# Patient Record
Sex: Male | Born: 1995
Health system: Southern US, Community
[De-identification: ages and names within clinical notes are randomized; demographics above are authoritative.]

## PROBLEM LIST (undated history)

## (undated) DIAGNOSIS — J45909 Unspecified asthma, uncomplicated: Secondary | ICD-10-CM

---
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
## 2013-05-09 ENCOUNTER — Encounter (HOSPITAL_COMMUNITY): Payer: Self-pay | Admitting: Emergency Medicine

## 2013-05-09 ENCOUNTER — Emergency Department (HOSPITAL_COMMUNITY)
Admission: EM | Admit: 2013-05-09 | Discharge: 2013-05-09 | Disposition: A | Discharge status: Home / Self Care | Payer: 59 | Source: Home / Self Care | Attending: Emergency Medicine | Admitting: Emergency Medicine

## 2013-05-09 DIAGNOSIS — H109 Unspecified conjunctivitis: Secondary | ICD-10-CM

## 2013-05-09 DIAGNOSIS — Z23 Encounter for immunization: Secondary | ICD-10-CM

## 2013-05-09 HISTORY — DX: Unspecified asthma, uncomplicated: J45.909

## 2013-05-09 MED ORDER — INFLUENZA VAC SPLIT QUAD 0.5 ML IM SUSP
INTRAMUSCULAR | Status: AC
  Filled 2013-05-09: qty 0.5

## 2013-05-09 MED ORDER — POLYMYXIN B-TRIMETHOPRIM 10000-0.1 UNIT/ML-% OP SOLN: 1.0000 [drp] | OPHTHALMIC | Status: DC

## 2013-05-09 MED ORDER — INFLUENZA VAC SPLIT QUAD 0.5 ML IM SUSP
0.5000 mL | INTRAMUSCULAR | Status: AC
  Administered 2013-05-09: 0.5 mL via INTRAMUSCULAR

## 2013-05-09 NOTE — ED Provider Notes (Signed)
**Note De-Identified [place] Obfuscation** Chief Complaint:   Chief Complaint  Patient presents with  . Conjunctivitis    History of Present Illness:   Todd Stephenson is a 17 year old male who has had a two-day history of redness of the right eye, irritation, and yellowish green drainage. He denies any changes in his vision, photophobia, involvement of the left side, fever, chills, headache, nasal congestion, rhinorrhea, sore throat, adenopathy, or cough. He has had no sick exposures.  Review of Systems:  Other than noted above, the patient denies any of the following symptoms: Systemic:  No fever, chills, sweats, fatigue, or weight loss. Eye:  No redness, eye pain, photophobia, discharge, blurred vision, or diplopia. ENT:  No nasal congestion, rhinorrhea, or sore throat. Lymphatic:  No adenopathy. Skin:  No rash or pruritis.  PMFSH:  Past medical history, family history, social history, meds, and allergies were reviewed.  He has a history of asthma and takes Pulmicort.  Physical Exam:   Vital signs:  BP 137/90  Pulse 68  Temp(Src) 98.3 F (36.8 C) (Oral)  Resp 18  SpO2 100% General:  Alert and in no distress. Eye:  Lids and conjunctiva is are normal. There is no injection, no discharge, cornea was intact to fluorescein staining, anterior chamber was normal, PERRLA, full EOMs, fundi were benign. ENT:  TMs and canals clear.  Nasal mucosa normal.  No intra-oral lesions, mucous membranes moist, pharynx clear. Neck:  No adenopathy tenderness or mass. Skin:  Clear, warm and dry.  Assessment:  The encounter diagnosis was Conjunctivitis.  He has minimal conjunctivitis. His eye looking to normal today.  Plan:   1.  Meds:  The following meds were prescribed:   Discharge Medication List as of 05/09/2013  5:12 PM    START taking these medications   Details  trimethoprim-polymyxin b (POLYTRIM) ophthalmic solution Place 1 drop into the right eye every 4 (four) hours., Starting 05/09/2013, Until Discontinued, Normal        2.   Patient Education/Counseling:  The patient was given appropriate handouts, self care instructions, and instructed in symptomatic relief.  Advised moist warm compresses, avoidance of rubbing the eyes, and use of sunglasses.  3.  Follow up:  The patient was told to follow up if no better in 3 to 4 days, if becoming worse in any way, and given some red flag symptoms such as any changes in his vision which would prompt immediate return.  Follow up here if needed.      Reuben Likes, MD 05/09/13 [phone]

## 2013-05-09 NOTE — ED Notes (Signed)
**Note De-Identified [place]elle[place] Obfuscation** C/o pink eye OD onset yesterday morning with greenish yellow drainage.  No visual difficulty.

## 2017-08-05 ENCOUNTER — Other Ambulatory Visit: Payer: Self-pay

## 2017-08-05 ENCOUNTER — Encounter (HOSPITAL_COMMUNITY): Payer: Self-pay | Admitting: Emergency Medicine

## 2017-08-05 ENCOUNTER — Ambulatory Visit (HOSPITAL_COMMUNITY)
Admission: EM | Admit: 2017-08-05 | Discharge: 2017-08-05 | Disposition: A | Discharge status: Home / Self Care | Payer: 59 | Attending: Internal Medicine | Admitting: Internal Medicine

## 2017-08-05 DIAGNOSIS — H5712 Ocular pain, left eye: Secondary | ICD-10-CM | POA: Diagnosis not present

## 2017-08-05 MED ORDER — TETRACAINE HCL 0.5 % OP SOLN
OPHTHALMIC | Status: AC
  Filled 2017-08-05: qty 4

## 2017-08-05 NOTE — ED Triage Notes (Signed)
**Note De-Identified [place] Obfuscation** Pt c/o L eye issues, redness, swelling, drainage x4 days.

## 2017-08-05 NOTE — Discharge Instructions (Signed)
**Note De-Identified Gerke Obfuscation** [facility]

## 2017-08-05 NOTE — ED Provider Notes (Signed)
**Note De-Identified Cavey Obfuscation** [facility]    CSN: [id number] Arrival date & time: 08/05/17  1311     History   Chief Complaint Chief Complaint  Patient presents with  . Conjunctivitis    HPI Akari Crysler Turley is a 22 y.o. male.   Kahner presents with complaints of left eye redness, tearing, swelling and pain which has been worsening over the past 4 days. He feels the tears are worsening his vision. He has pain to his eye ball as well as to his lid and to his face. He does not have an eye doctor. Does not use contacts or glasses. Rates pain 8/10. States he felt it was a stye originally with white area under his upper lid. Without pus or discharge from the eye, without am mattering. He feels he has had low grade fevers. Denies previous similar.    ROS per HPI.       Past Medical History:  Diagnosis Date  . Asthma    as a child    Patient Active Problem List   Diagnosis Date Noted  . Well child check 06/02/2012  . Obesity 06/02/2012  . Immunization due 12/30/2011  . Asthma night-time symptoms 06/02/2011    Class: Diagnosis of    History reviewed. No pertinent surgical history.     Home Medications    Prior to Admission medications   Medication Sig Start Date End Date Taking? Authorizing Provider  budesonide (PULMICORT FLEXHALER) 180 MCG/ACT inhaler Inhale 1-2 puffs into the lungs 2 (two) times daily. Patient not taking: Reported on 08/05/2017 02/23/12 03/24/12  Georgiann Hahn, MD  budesonide (PULMICORT FLEXHALER) 180 MCG/ACT inhaler Inhale 1 puff into the lungs 2 (two) times daily. 90 day supply Patient not taking: Reported on 08/05/2017 06/02/12   Georgiann Hahn, MD  pirbuterol (MAXAIR AUTOHALER) 200 MCG/INH inhaler Inhale 2 puffs into the lungs 4 (four) times daily. 90 day supply Patient not taking: Reported on 08/05/2017 06/02/12   Georgiann Hahn, MD  trimethoprim-polymyxin b (POLYTRIM) ophthalmic solution Place 1 drop into the right eye every 4 (four) hours. Patient not taking:  Reported on 08/05/2017 05/09/13   Reuben Likes, MD    Family History No family history on file.  Social History Social History   Tobacco Use  . Smoking status: Never Smoker  Substance Use Topics  . Alcohol use: No  . Drug use: No     Allergies   Patient has no known allergies.   Review of Systems Review of Systems   Physical Exam Triage Vital Signs ED Triage Vitals [08/05/17 1444]  Enc Vitals Group     BP      Pulse      Resp      Temp      Temp src      SpO2      Weight      Height      Head Circumference      Peak Flow      Pain Score 7     Pain Loc      Pain Edu?      Excl. in GC?    No data found.  Updated Vital Signs There were no vitals taken for this visit.  Visual Acuity Right Eye Distance: (S) 20/20 w/o corrections Left Eye Distance: (S) 20/25 w/o corrections Bilateral Distance: (S) 20/20 w/o corrections  Right Eye Near:   Left Eye Near:    Bilateral Near:     Physical Exam  Constitutional: He is oriented **Note De-Identified Breden Obfuscation** to person, place, and time. He appears well-developed and well-nourished.  Eyes: Pupils are equal, round, and reactive to light. Left conjunctiva is injected. Left eye exhibits normal extraocular motion and no nystagmus.  Left eye with swelling to conjunctiva, upper and lower lids; tender; swelling extends to left temple; fluorescein deferred as [facility]  Cardiovascular: Normal rate and regular rhythm.  Pulmonary/Chest: Effort normal and breath sounds normal.  Neurological: He is alert and oriented to person, place, and time.  Skin: Skin is warm and dry.     UC Treatments / Results  Labs (all labs ordered are listed, but only abnormal results are displayed) Labs Reviewed - No data to display  EKG  EKG Interpretation None       Radiology No results found.  Procedures Procedures (including critical care time)  Medications Ordered in UC Medications - No data to  display   Initial Impression / Assessment and Plan / UC Course  I have reviewed the triage vital signs and the nursing notes.  Pertinent labs & imaging results that were available during my care of the patient were reviewed by me and considered in my medical decision making (see chart for details).     Eye lid and eye ball pain with excessive swelling, tenderness and tearing. Call to [facility] office and they are able to see patient now, patient agreeable to thorough eye evaluation and safe for self transport.   Final Clinical Impressions(s) / UC Diagnoses   Final diagnoses:  Eye pain, left    ED Discharge Orders    None       Controlled Substance Prescriptions [place] Controlled Substance Registry consulted? Not Applicable   Georgetta HaberBurky, Natalie B, NP 08/05/17 1558

## 2018-06-14 ENCOUNTER — Other Ambulatory Visit: Payer: Self-pay

## 2018-06-14 ENCOUNTER — Encounter (HOSPITAL_COMMUNITY): Payer: Self-pay | Admitting: Pharmacy Technician

## 2018-06-14 ENCOUNTER — Emergency Department (HOSPITAL_COMMUNITY): Payer: No Typology Code available for payment source

## 2018-06-14 ENCOUNTER — Emergency Department (HOSPITAL_COMMUNITY)
Admission: EM | Admit: 2018-06-14 | Discharge: 2018-06-15 | Disposition: A | Discharge status: Home / Self Care | Payer: No Typology Code available for payment source | Attending: Emergency Medicine | Admitting: Emergency Medicine

## 2018-06-14 DIAGNOSIS — S2242XA Multiple fractures of ribs, left side, initial encounter for closed fracture: Secondary | ICD-10-CM

## 2018-06-14 DIAGNOSIS — Y939 Activity, unspecified: Secondary | ICD-10-CM | POA: Diagnosis not present

## 2018-06-14 DIAGNOSIS — S80812A Abrasion, left lower leg, initial encounter: Secondary | ICD-10-CM | POA: Diagnosis not present

## 2018-06-14 DIAGNOSIS — R319 Hematuria, unspecified: Secondary | ICD-10-CM

## 2018-06-14 DIAGNOSIS — Y999 Unspecified external cause status: Secondary | ICD-10-CM | POA: Diagnosis not present

## 2018-06-14 DIAGNOSIS — S80211A Abrasion, right knee, initial encounter: Secondary | ICD-10-CM | POA: Insufficient documentation

## 2018-06-14 DIAGNOSIS — S32020A Wedge compression fracture of second lumbar vertebra, initial encounter for closed fracture: Secondary | ICD-10-CM | POA: Diagnosis not present

## 2018-06-14 DIAGNOSIS — S40212A Abrasion of left shoulder, initial encounter: Secondary | ICD-10-CM | POA: Insufficient documentation

## 2018-06-14 DIAGNOSIS — T07XXXA Unspecified multiple injuries, initial encounter: Secondary | ICD-10-CM | POA: Diagnosis not present

## 2018-06-14 DIAGNOSIS — Y929 Unspecified place or not applicable: Secondary | ICD-10-CM | POA: Insufficient documentation

## 2018-06-14 LAB — COMPREHENSIVE METABOLIC PANEL
ALBUMIN: 4.4 g/dL (ref 3.5–5.0)
ALT: 53 U/L — ABNORMAL HIGH (ref 0–44)
ANION GAP: 9 (ref 5–15)
AST: 52 U/L — ABNORMAL HIGH (ref 15–41)
Alkaline Phosphatase: 56 U/L (ref 38–126)
BUN: 14 mg/dL (ref 6–20)
CHLORIDE: 104 mmol/L (ref 98–111)
CO2: 24 mmol/L (ref 22–32)
Calcium: 9.4 mg/dL (ref 8.9–10.3)
Creatinine, Ser: 1.37 mg/dL — ABNORMAL HIGH (ref 0.61–1.24)
GFR calc Af Amer: >60 mL/min (ref >60)
GFR calc non Af Amer: >60 mL/min (ref >60)
GLUCOSE: 116 mg/dL — AB (ref 70–99)
POTASSIUM: 3.6 mmol/L (ref 3.5–5.1)
Sodium: 137 mmol/L (ref 135–145)
TOTAL PROTEIN: 7 g/dL (ref 6.5–8.1)
Total Bilirubin: 1.4 mg/dL — ABNORMAL HIGH (ref 0.3–1.2)

## 2018-06-14 LAB — SAMPLE TO BLOOD BANK

## 2018-06-14 LAB — I-STAT CHEM 8, ED
BUN: 15 mg/dL (ref 6–20)
CALCIUM ION: 1.12 mmol/L — AB (ref 1.15–1.40)
Chloride: 104 mmol/L (ref 98–111)
Creatinine, Ser: 1.2 mg/dL (ref 0.61–1.24)
Glucose, Bld: 110 mg/dL — ABNORMAL HIGH (ref 70–99)
HEMATOCRIT: 48 % (ref 39.0–52.0)
HEMOGLOBIN: 16.3 g/dL (ref 13.0–17.0)
Potassium: 3.6 mmol/L (ref 3.5–5.1)
SODIUM: 137 mmol/L (ref 135–145)
TCO2: 24 mmol/L (ref 22–32)

## 2018-06-14 LAB — ETHANOL

## 2018-06-14 LAB — CBC
HEMATOCRIT: 46.5 % (ref 39.0–52.0)
HEMOGLOBIN: 14.9 g/dL (ref 13.0–17.0)
MCH: 25.8 pg — AB (ref 26.0–34.0)
MCHC: 32 g/dL (ref 30.0–36.0)
MCV: 80.4 fL (ref 80.0–100.0)
Platelets: 177 10*3/uL (ref 150–400)
RBC: 5.78 MIL/uL (ref 4.22–5.81)
RDW: 12.9 % (ref 11.5–15.5)
WBC: 24.8 10*3/uL — AB (ref 4.0–10.5)
nRBC: 0 % (ref 0.0–0.2)

## 2018-06-14 LAB — PROTIME-INR
INR: 1.02
Prothrombin Time: 13.3 seconds (ref 11.4–15.2)

## 2018-06-14 LAB — I-STAT CG4 LACTIC ACID, ED: LACTIC ACID, VENOUS: 1.94 mmol/L — AB (ref 0.5–1.9)

## 2018-06-14 LAB — CDS SEROLOGY

## 2018-06-14 MED ORDER — HYDROMORPHONE HCL 1 MG/ML IJ SOLN
0.5000 mg | Freq: Once | INTRAMUSCULAR | Status: AC
  Administered 2018-06-14: 0.5 mg via INTRAVENOUS
  Filled 2018-06-14: qty 1

## 2018-06-14 MED ORDER — SODIUM CHLORIDE 0.9 % IV BOLUS
125.0000 mL | Freq: Once | INTRAVENOUS | Status: AC
  Administered 2018-06-14: 125 mL via INTRAVENOUS

## 2018-06-14 MED ORDER — IOHEXOL 300 MG/ML  SOLN
100.0000 mL | Freq: Once | INTRAMUSCULAR | Status: AC | PRN
  Administered 2018-06-15: 100 mL via INTRAVENOUS
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
## 2018-06-15 ENCOUNTER — Emergency Department (HOSPITAL_COMMUNITY): Payer: No Typology Code available for payment source

## 2018-06-15 DIAGNOSIS — S2242XA Multiple fractures of ribs, left side, initial encounter for closed fracture: Secondary | ICD-10-CM | POA: Diagnosis not present

## 2018-06-15 LAB — URINALYSIS, ROUTINE W REFLEX MICROSCOPIC
Bacteria, UA: NONE SEEN
Bilirubin Urine: NEGATIVE
GLUCOSE, UA: NEGATIVE mg/dL
Ketones, ur: 20 mg/dL — AB
Leukocytes, UA: NEGATIVE
Nitrite: NEGATIVE
PROTEIN: 30 mg/dL — AB
RBC / HPF: >50 RBC/hpf — ABNORMAL HIGH (ref 0–5)
SPECIFIC GRAVITY, URINE: 1.036 — AB (ref 1.005–1.030)
pH: 6 (ref 5.0–8.0)

## 2018-06-15 MED ORDER — IBUPROFEN 600 MG PO TABS: 600.0000 mg | ORAL_TABLET | Freq: Four times a day (QID) | ORAL | 0 refills | Status: DC | PRN

## 2018-06-15 MED ORDER — OXYCODONE-ACETAMINOPHEN 5-325 MG PO TABS: 1.0000 | ORAL_TABLET | Freq: Four times a day (QID) | ORAL | 0 refills | Status: DC | PRN

## 2018-06-15 MED ORDER — METHOCARBAMOL 500 MG PO TABS: 500.0000 mg | ORAL_TABLET | Freq: Two times a day (BID) | ORAL | 0 refills | Status: DC

## 2018-06-15 MED ORDER — KETOROLAC TROMETHAMINE 30 MG/ML IJ SOLN
15.0000 mg | Freq: Once | INTRAMUSCULAR | Status: AC
  Administered 2018-06-15: 15 mg via INTRAVENOUS
  Filled 2018-06-15: qty 1

## 2018-06-15 MED ORDER — HYDROMORPHONE HCL 1 MG/ML IJ SOLN
1.0000 mg | Freq: Once | INTRAMUSCULAR | Status: AC
  Administered 2018-06-15: 1 mg via INTRAVENOUS
  Filled 2018-06-15: qty 1

## 2018-06-15 MED ORDER — METHOCARBAMOL 500 MG PO TABS
500.0000 mg | ORAL_TABLET | Freq: Once | ORAL | Status: AC
  Administered 2018-06-15: 500 mg via ORAL
  Filled 2018-06-15: qty 1

## 2018-06-15 NOTE — Discharge Instructions (Signed)
**Note De-Identified Metzinger Obfuscation** Your x-rays, including CT scans show 2 rib fractures without lung injury, and a compression fracture of L2 with a minimal loss of height. These injuries should heal over time without further intervention.   There is blood in your urine and this will need to be rechecked in one week to insure it has resolved. This can be done with your doctor.  Ice to the sore areas. You can expect to become more sore over the next 2-3 days before you see some improvement. Take medications as prescribed. Follow up with your doctor for recheck if further pain management is needed.   Return to the emergency department with any new concerns at any time.

## 2018-06-15 NOTE — ED Notes (Signed)
**Note De-identified Sawtelle Obfuscation** Pt taken to CT.

## 2018-06-15 NOTE — ED Notes (Signed)
**Note De-identified Besaw Obfuscation** Pt ambulated in room with steady gait  

## 2018-06-20 DIAGNOSIS — S2242XD Multiple fractures of ribs, left side, subsequent encounter for fracture with routine healing: Secondary | ICD-10-CM | POA: Diagnosis not present

## 2018-06-20 DIAGNOSIS — M25561 Pain in right knee: Secondary | ICD-10-CM | POA: Diagnosis not present

## 2018-06-20 DIAGNOSIS — R319 Hematuria, unspecified: Secondary | ICD-10-CM | POA: Diagnosis not present

## 2018-06-20 DIAGNOSIS — S32020D Wedge compression fracture of second lumbar vertebra, subsequent encounter for fracture with routine healing: Secondary | ICD-10-CM | POA: Diagnosis not present

## 2018-06-29 DIAGNOSIS — M25561 Pain in right knee: Secondary | ICD-10-CM | POA: Diagnosis not present

## 2018-06-29 DIAGNOSIS — M25512 Pain in left shoulder: Secondary | ICD-10-CM | POA: Diagnosis not present

## 2018-07-01 DIAGNOSIS — M549 Dorsalgia, unspecified: Secondary | ICD-10-CM | POA: Diagnosis not present

## 2018-07-01 DIAGNOSIS — Z6829 Body mass index (BMI) 29.0-29.9, adult: Secondary | ICD-10-CM | POA: Diagnosis not present

## 2018-07-01 DIAGNOSIS — M545 Low back pain: Secondary | ICD-10-CM | POA: Diagnosis not present

## 2018-07-01 DIAGNOSIS — S32020A Wedge compression fracture of second lumbar vertebra, initial encounter for closed fracture: Secondary | ICD-10-CM | POA: Diagnosis not present

## 2018-07-26 DIAGNOSIS — Z13220 Encounter for screening for lipoid disorders: Secondary | ICD-10-CM | POA: Diagnosis not present

## 2018-07-26 DIAGNOSIS — Z0000 Encounter for general adult medical examination without abnormal findings: Secondary | ICD-10-CM | POA: Diagnosis not present
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
## 2020-01-05 ENCOUNTER — Other Ambulatory Visit: Payer: Self-pay

## 2020-01-05 ENCOUNTER — Ambulatory Visit
Admission: EM | Admit: 2020-01-05 | Discharge: 2020-01-05 | Disposition: A | Discharge status: Home / Self Care | Payer: Federal, State, Local not specified - PPO

## 2020-01-05 DIAGNOSIS — H00012 Hordeolum externum right lower eyelid: Secondary | ICD-10-CM

## 2020-01-05 NOTE — ED Provider Notes (Signed)
**Note De-Identified Grenier Obfuscation** [facility]    CSN: [id number] Arrival date & time: 01/05/20  1227      History   Chief Complaint Chief Complaint  Patient presents with  . Eye Pain    HPI Todd Stephenson is a 24 y.o. male presenting for right lower eyelid pain and swelling for the last week.  No visual changes, photophobia, trauma to the affected area.  Denies contact or glasses use.  Has had styes before, states this feels similar.  Tried ibuprofen once, heating pad once or twice without relief.   Past Medical History:  Diagnosis Date  . Asthma    as a child    Patient Active Problem List   Diagnosis Date Noted  . Well child check 06/02/2012  . Obesity 06/02/2012  . Immunization due 12/30/2011  . Asthma night-time symptoms 06/02/2011    Class: Diagnosis of    History reviewed. No pertinent surgical history.     Home Medications    Prior to Admission medications   Not on File    Family History History reviewed. No pertinent family history.  Social History Social History   Tobacco Use  . Smoking status: Never Smoker  Substance Use Topics  . Alcohol use: No  . Drug use: No     Allergies   Patient has no known allergies.   Review of Systems As per HPI   Physical Exam Triage Vital Signs ED Triage Vitals  Enc Vitals Group     BP      Pulse      Resp      Temp      Temp src      SpO2      Weight      Height      Head Circumference      Peak Flow      Pain Score      Pain Loc      Pain Edu?      Excl. in GC?    No data found.  Updated Vital Signs BP 137/83   Pulse 65   Temp 98 F (36.7 C)   Resp 18   SpO2 98%   Visual Acuity Right Eye Distance:   Left Eye Distance:   Bilateral Distance:    Right Eye Near:   Left Eye Near:    Bilateral Near:     Physical Exam Constitutional:      General: He is not in acute distress. HENT:     Head: Normocephalic and atraumatic.  Eyes:     General: No scleral icterus.       Right eye: No discharge.          Left eye: No discharge.     Extraocular Movements: Extraocular movements intact.     Conjunctiva/sclera: Conjunctivae normal.     Pupils: Pupils are equal, round, and reactive to light.     Comments: No foreign body appreciated bilaterally.  Patient does have 4 mm round growth inside left lower lid.  Nontender and without fluctuance, erythema.  Cardiovascular:     Rate and Rhythm: Normal rate.  Pulmonary:     Effort: Pulmonary effort is normal. No respiratory distress.     Breath sounds: No wheezing.  Skin:    Coloration: Skin is not jaundiced or pale.  Neurological:     Mental Status: He is alert and oriented to person, place, and time.      UC Treatments / Results  Labs (all labs **Note De-Identified Ozawa Obfuscation** ordered are listed, but only abnormal results are displayed) Labs Reviewed - No data to display  EKG   Radiology No results found.  Procedures Procedures (including critical care time)  Medications Ordered in UC Medications - No data to display  Initial Impression / Assessment and Plan / UC Course  I have reviewed the triage vital signs and the nursing notes.  Pertinent labs & imaging results that were available during my care of the patient were reviewed by me and considered in my medical decision making (see chart for details).     Patient appears well in office today and does not have any change in visual acuity.  We will treat supportively as outlined below for stye, though discussed could also be granular growth: Contact information provided for ophthalmologic follow-up.  Return precautions discussed, pt verbalized understanding and is agreeable to plan. Final Clinical Impressions(s) / UC Diagnoses   Final diagnoses:  Hordeolum externum of right lower eyelid     Discharge Instructions     Keep area(s) clean and dry. Apply hot compress / towel for 5-10 minutes 3-5 times daily. Return for worsening pain, redness, swelling, discharge, fever.  Helpful prevention tips: Keep  nails short to avoid secondary skin infections.    ED Prescriptions    None     PDMP not reviewed this encounter.   Hall-Potvin, [place], [place] 01/05/20 1408

## 2020-01-05 NOTE — Discharge Instructions (Addendum)
**Note De-identified Fick Obfuscation** Keep area(s) clean and dry. Apply hot compress / towel for 5-10 minutes 3-5 times daily. Return for worsening pain, redness, swelling, discharge, fever.  Helpful prevention tips: Keep nails short to avoid secondary skin infections. 

## 2020-01-05 NOTE — ED Triage Notes (Signed)
**Note De-Identified Colavito Obfuscation** Pt c/o right lower eye lid pain and swelling x 1 week, no vision changes
